# Patient Record
Sex: Male | Born: 1980
Health system: Southern US, Community
[De-identification: ages and names within clinical notes are randomized; demographics above are authoritative.]

## PROBLEM LIST (undated history)

## (undated) DIAGNOSIS — I10 Essential (primary) hypertension: Secondary | ICD-10-CM

## (undated) HISTORY — DX: Essential (primary) hypertension: I10

---

## 2008-03-03 ENCOUNTER — Emergency Department (HOSPITAL_COMMUNITY): Admission: EM | Admit: 2008-03-03 | Discharge: 2008-03-03 | Payer: Self-pay | Admitting: Emergency Medicine

## 2012-07-16 ENCOUNTER — Emergency Department (HOSPITAL_COMMUNITY)
Admission: EM | Admit: 2012-07-16 | Discharge: 2012-07-16 | Disposition: A | Discharge status: Home / Self Care | Payer: No Typology Code available for payment source | Attending: Emergency Medicine | Admitting: Emergency Medicine

## 2012-07-16 ENCOUNTER — Encounter (HOSPITAL_COMMUNITY): Payer: Self-pay | Admitting: Emergency Medicine

## 2012-07-16 ENCOUNTER — Emergency Department (HOSPITAL_COMMUNITY): Payer: No Typology Code available for payment source

## 2012-07-16 DIAGNOSIS — S199XXA Unspecified injury of neck, initial encounter: Secondary | ICD-10-CM | POA: Insufficient documentation

## 2012-07-16 DIAGNOSIS — M549 Dorsalgia, unspecified: Secondary | ICD-10-CM | POA: Insufficient documentation

## 2012-07-16 DIAGNOSIS — R209 Unspecified disturbances of skin sensation: Secondary | ICD-10-CM | POA: Insufficient documentation

## 2012-07-16 DIAGNOSIS — Y92410 Unspecified street and highway as the place of occurrence of the external cause: Secondary | ICD-10-CM | POA: Insufficient documentation

## 2012-07-16 DIAGNOSIS — S0993XA Unspecified injury of face, initial encounter: Secondary | ICD-10-CM | POA: Insufficient documentation

## 2012-07-16 DIAGNOSIS — Y9389 Activity, other specified: Secondary | ICD-10-CM | POA: Insufficient documentation

## 2012-07-16 MED ORDER — CYCLOBENZAPRINE HCL 10 MG PO TABS
10.0000 mg | ORAL_TABLET | Freq: Once | ORAL | Status: AC
  Administered 2012-07-16: 10 mg via ORAL
  Filled 2012-07-16: qty 1

## 2012-07-16 MED ORDER — CYCLOBENZAPRINE HCL 10 MG PO TABS: 10.0000 mg | ORAL_TABLET | Freq: Two times a day (BID) | ORAL | Status: DC | PRN
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
## 2012-07-16 NOTE — ED Provider Notes (Signed)
Medical screening examination/treatment/procedure(s) were performed by non-physician practitioner and as supervising physician I was immediately available for consultation/collaboration.  Derwood Kaplan, MD 07/16/12 1714

## 2012-07-23 ENCOUNTER — Emergency Department (HOSPITAL_COMMUNITY)
Admission: EM | Admit: 2012-07-23 | Discharge: 2012-07-23 | Disposition: A | Discharge status: Home / Self Care | Payer: No Typology Code available for payment source | Attending: Emergency Medicine | Admitting: Emergency Medicine

## 2012-07-23 ENCOUNTER — Encounter (HOSPITAL_COMMUNITY): Payer: Self-pay | Admitting: *Deleted

## 2012-07-23 DIAGNOSIS — Z87828 Personal history of other (healed) physical injury and trauma: Secondary | ICD-10-CM | POA: Insufficient documentation

## 2012-07-23 DIAGNOSIS — M542 Cervicalgia: Secondary | ICD-10-CM

## 2012-07-23 DIAGNOSIS — G8911 Acute pain due to trauma: Secondary | ICD-10-CM | POA: Insufficient documentation

## 2012-07-23 DIAGNOSIS — M549 Dorsalgia, unspecified: Secondary | ICD-10-CM | POA: Insufficient documentation

## 2012-07-23 MED ORDER — IBUPROFEN 600 MG PO TABS: 600.0000 mg | ORAL_TABLET | Freq: Four times a day (QID) | ORAL | Status: DC | PRN

## 2012-07-23 MED ORDER — METHOCARBAMOL 500 MG PO TABS: 1000.0000 mg | ORAL_TABLET | Freq: Four times a day (QID) | ORAL | Status: DC

## 2012-07-23 MED ORDER — HYDROCODONE-ACETAMINOPHEN 5-325 MG PO TABS: ORAL_TABLET | ORAL | Status: DC
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
## 2012-07-25 NOTE — ED Provider Notes (Signed)
Medical screening examination/treatment/procedure(s) were conducted as a shared visit with non-physician practitioner(s) and myself.  I personally evaluated the patient during the encounter  Gianlucas Evenson, MD 07/25/12 1408 

## 2012-07-31 ENCOUNTER — Encounter (HOSPITAL_COMMUNITY): Payer: Self-pay | Admitting: Emergency Medicine

## 2012-07-31 ENCOUNTER — Emergency Department (HOSPITAL_COMMUNITY)
Admission: EM | Admit: 2012-07-31 | Discharge: 2012-07-31 | Disposition: A | Discharge status: Home / Self Care | Payer: No Typology Code available for payment source | Attending: Emergency Medicine | Admitting: Emergency Medicine

## 2012-07-31 DIAGNOSIS — M549 Dorsalgia, unspecified: Secondary | ICD-10-CM

## 2012-07-31 DIAGNOSIS — M5380 Other specified dorsopathies, site unspecified: Secondary | ICD-10-CM | POA: Insufficient documentation

## 2012-07-31 DIAGNOSIS — Y92410 Unspecified street and highway as the place of occurrence of the external cause: Secondary | ICD-10-CM | POA: Insufficient documentation

## 2012-07-31 DIAGNOSIS — M62830 Muscle spasm of back: Secondary | ICD-10-CM

## 2012-07-31 DIAGNOSIS — S29019A Strain of muscle and tendon of unspecified wall of thorax, initial encounter: Secondary | ICD-10-CM

## 2012-07-31 DIAGNOSIS — IMO0002 Reserved for concepts with insufficient information to code with codable children: Secondary | ICD-10-CM | POA: Insufficient documentation

## 2012-07-31 DIAGNOSIS — Y9389 Activity, other specified: Secondary | ICD-10-CM | POA: Insufficient documentation

## 2012-07-31 DIAGNOSIS — S239XXA Sprain of unspecified parts of thorax, initial encounter: Secondary | ICD-10-CM | POA: Insufficient documentation

## 2012-07-31 MED ORDER — HYDROCODONE-ACETAMINOPHEN 5-325 MG PO TABS
2.0000 | ORAL_TABLET | Freq: Once | ORAL | Status: AC
  Administered 2012-07-31: 2 via ORAL
  Filled 2012-07-31: qty 2

## 2012-07-31 MED ORDER — DIAZEPAM 5 MG PO TABS
10.0000 mg | ORAL_TABLET | Freq: Once | ORAL | Status: AC
  Administered 2012-07-31: 10 mg via ORAL
  Filled 2012-07-31: qty 1

## 2012-07-31 MED ORDER — MELOXICAM 15 MG PO TABS: 15.0000 mg | ORAL_TABLET | Freq: Every day | ORAL | Status: DC

## 2012-07-31 MED ORDER — OXYCODONE-ACETAMINOPHEN 5-325 MG PO TABS: 1.0000 | ORAL_TABLET | ORAL | Status: DC | PRN

## 2012-07-31 MED ORDER — DIAZEPAM 5 MG PO TABS: 5.0000 mg | ORAL_TABLET | Freq: Three times a day (TID) | ORAL | Status: DC | PRN
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
## 2012-08-01 NOTE — ED Provider Notes (Signed)
Medical screening examination/treatment/procedure(s) were performed by non-physician practitioner and as supervising physician I was immediately available for consultation/collaboration.  Jones Skene, M.D.     Jones Skene, MD 08/01/12 1643

## 2013-04-26 ENCOUNTER — Encounter (HOSPITAL_COMMUNITY): Payer: Self-pay | Admitting: *Deleted

## 2013-04-26 ENCOUNTER — Emergency Department (HOSPITAL_COMMUNITY)
Admission: EM | Admit: 2013-04-26 | Discharge: 2013-04-26 | Disposition: A | Discharge status: Home / Self Care | Payer: 59 | Attending: Emergency Medicine | Admitting: Emergency Medicine

## 2013-04-26 DIAGNOSIS — Y9389 Activity, other specified: Secondary | ICD-10-CM | POA: Insufficient documentation

## 2013-04-26 DIAGNOSIS — Z23 Encounter for immunization: Secondary | ICD-10-CM | POA: Insufficient documentation

## 2013-04-26 DIAGNOSIS — Y9289 Other specified places as the place of occurrence of the external cause: Secondary | ICD-10-CM | POA: Insufficient documentation

## 2013-04-26 DIAGNOSIS — W268XXA Contact with other sharp object(s), not elsewhere classified, initial encounter: Secondary | ICD-10-CM | POA: Insufficient documentation

## 2013-04-26 DIAGNOSIS — S61209A Unspecified open wound of unspecified finger without damage to nail, initial encounter: Secondary | ICD-10-CM | POA: Insufficient documentation

## 2013-04-26 DIAGNOSIS — IMO0002 Reserved for concepts with insufficient information to code with codable children: Secondary | ICD-10-CM

## 2013-04-26 MED ORDER — TETANUS-DIPHTH-ACELL PERTUSSIS 5-2.5-18.5 LF-MCG/0.5 IM SUSP
0.5000 mL | Freq: Once | INTRAMUSCULAR | Status: AC
  Administered 2013-04-26: 0.5 mL via INTRAMUSCULAR
  Filled 2013-04-26: qty 0.5

## 2013-04-26 NOTE — ED Provider Notes (Addendum)
CSN: [id number]     Arrival date & time 04/26/13  7829 History   First MD Initiated Contact with Patient 04/26/13 [phone]     Chief Complaint  Patient presents with  . Laceration   (Consider location/radiation/quality/duration/timing/severity/associated sxs/prior Treatment) HPI Comments: Laceration to L lateral pinkie finger from sheet metal about 20 minutes ago.  Bleeding controlled.  Tetanus not up to date. Flexion, extension, sensation intact.    The history is provided by the patient.    History reviewed. No pertinent past medical history. History reviewed. No pertinent past surgical history. No family history on file. History  Substance Use Topics  . Smoking status: Never Smoker   . Smokeless tobacco: Not on file  . Alcohol Use: Yes     Comment: occassionally    Review of Systems  Constitutional: Negative for activity change and appetite change.  HENT: Negative for congestion and rhinorrhea.   Respiratory: Negative for chest tightness and shortness of breath.   Gastrointestinal: Negative for vomiting and abdominal pain.  Genitourinary: Negative for dysuria and hematuria.  Musculoskeletal: Negative for back pain.  Skin: Positive for wound.  Neurological: Negative for dizziness, weakness and headaches.  A complete 10 system review of systems was obtained and all systems are negative except as noted in the HPI and PMH.    Allergies  Review of patient's allergies indicates no known allergies.  Home Medications   Current Outpatient Rx  Name  Route  Sig  Dispense  Refill  . Multiple Vitamin (MULTIVITAMIN WITH MINERALS) TABS tablet   Oral   Take 1 tablet by mouth daily.          BP 145/98  Pulse 69  Temp(Src) 98.3 F (36.8 C) (Oral)  Resp 14  SpO2 96% Physical Exam  Constitutional: He is oriented to person, place, and time. He appears well-developed and well-nourished. No distress.  HENT:  Head: Normocephalic and atraumatic.  Mouth/Throat: Oropharynx is clear  and moist. No oropharyngeal exudate.  Eyes: Conjunctivae and EOM are normal. Pupils are equal, round, and reactive to light.  Neck: Normal range of motion. Neck supple.  Cardiovascular: Normal rate, regular rhythm and normal heart sounds.   Pulmonary/Chest: Effort normal and breath sounds normal. No respiratory distress.  Abdominal: Soft. There is no tenderness. There is no rebound and no guarding.  Musculoskeletal: Normal range of motion. He exhibits no edema and no tenderness.  4 cm laceration to left lateral pinky finger. FDS, FDP intact. Extension intact. Sensation intact distally.  Neurological: He is alert and oriented to person, place, and time. No cranial nerve deficit. He exhibits normal muscle tone. Coordination normal.  Skin: Skin is warm.    ED Course  NERVE BLOCK Performed by: Glynn Octave Authorized by: Glynn Octave Consent: Verbal consent obtained. Risks and benefits: risks, benefits and alternatives were discussed Consent given by: patient Patient understanding: patient states understanding of the procedure being performed Patient identity confirmed: verbally with patient and arm band Time out: Immediately prior to procedure a "time out" was called to verify the correct patient, procedure, equipment, support staff and site/side marked as required. Indications: extensive wound Body area: upper extremity Nerve: digital Laterality: left Patient sedated: no Preparation: Patient was prepped and draped in the usual sterile fashion. Patient position: sitting Needle gauge: 25 G Location technique: anatomical landmarks Local anesthetic: lidocaine 1% without epinephrine Anesthetic total: 8 ml Outcome: pain improved Patient tolerance: Patient tolerated the procedure well with no immediate complications.   (including critical care time) Labs  Review Labs Reviewed - No data to display Imaging Review No results found.  MDM   1. Laceration    Laceration to L  little finger from sheet metal.  Vitals stable.  No tendon or nerve involvement.  Tetanus updated. Digital block by myself. Wound repaired by NP Walker.  Suture removal in 1 week. Wound care and return precautions discussed.  Glynn Octave, MD 04/26/13 1011  Glynn Octave, MD 04/26/13 1325

## 2013-04-26 NOTE — ED Provider Notes (Signed)
CSN: [id number]     Arrival date & time 04/26/13  4098 History   First MD Initiated Contact with Patient 04/26/13 [phone]     Chief Complaint  Patient presents with  . Laceration   (Consider location/radiation/quality/duration/timing/severity/associated sxs/prior Treatment) HPI (see Dr. Randel Books note)   History reviewed. No pertinent past medical history. History reviewed. No pertinent past surgical history. No family history on file. History  Substance Use Topics  . Smoking status: Never Smoker   . Smokeless tobacco: Not on file  . Alcohol Use: Yes     Comment: occassionally    Review of Systems  Allergies  Review of patient's allergies indicates no known allergies.  Home Medications   Current Outpatient Rx  Name  Route  Sig  Dispense  Refill  . Multiple Vitamin (MULTIVITAMIN WITH MINERALS) TABS tablet   Oral   Take 1 tablet by mouth daily.          BP 145/98  Pulse 69  Temp(Src) 98.3 F (36.8 C) (Oral)  Resp 14  SpO2 96% Physical Exam  ED Course  Procedures (including critical care time) Labs Review Labs Reviewed - No data to display Imaging Review No results found. LACERATION REPAIR Performed by: Irish Elders Authorized by: Irish Elders Consent: Verbal consent obtained. Risks and benefits: risks, benefits and alternatives were discussed Consent given by: patient Patient identity confirmed: provided demographic data Prepped and Draped in normal sterile fashion Wound explored  Laceration Location: Left hand, 5th digit  Laceration Length:  3 cm  No Foreign Bodies seen or palpated  Anesthesia: local infiltration  Digital block by Dr, Manus Gunning, see note  Irrigation method: syringe Amount of cleaning: standard  Skin closure: 3.0 ethilon  Number of sutures: 12  Technique: single mattress  Patient tolerance: Patient tolerated the procedure well with no immediate complications.  Initially seen by Dr. Manus Gunning, see his note for HPI, ROS and  PE.   MDM  No diagnosis found.    Irish Elders, NP 04/26/13 4782  Irish Elders, NP 04/26/13 [phone]

## 2013-04-26 NOTE — ED Provider Notes (Signed)
Medical screening examination/treatment/procedure(s) were conducted as a shared visit with non-physician practitioner(s) and myself.  I personally evaluated the patient during the encounter  See my additional note  Glynn Octave, MD 04/26/13 1325

## 2013-04-26 NOTE — ED Notes (Signed)
Pt reports getting left pinky finger stuck in between sheets of metal about 20 minutes ago. Laceration to left pinky finger. Pt applying pressure with cloth - Is still actively bleeding. RN cleaned wound with saline and re-wrapped in gauze. Pt in NAD

## 2013-05-14 ENCOUNTER — Encounter (HOSPITAL_COMMUNITY): Payer: Self-pay | Admitting: Emergency Medicine

## 2013-05-14 ENCOUNTER — Emergency Department (HOSPITAL_COMMUNITY)
Admission: EM | Admit: 2013-05-14 | Discharge: 2013-05-14 | Disposition: A | Discharge status: Home / Self Care | Payer: 59 | Attending: Emergency Medicine | Admitting: Emergency Medicine

## 2013-05-14 DIAGNOSIS — R1013 Epigastric pain: Secondary | ICD-10-CM

## 2013-05-14 DIAGNOSIS — R197 Diarrhea, unspecified: Secondary | ICD-10-CM | POA: Insufficient documentation

## 2013-05-14 DIAGNOSIS — Z79899 Other long term (current) drug therapy: Secondary | ICD-10-CM | POA: Insufficient documentation

## 2013-05-14 DIAGNOSIS — R110 Nausea: Secondary | ICD-10-CM | POA: Insufficient documentation

## 2013-05-14 DIAGNOSIS — K3189 Other diseases of stomach and duodenum: Secondary | ICD-10-CM | POA: Insufficient documentation

## 2013-05-14 LAB — CBC WITH DIFFERENTIAL/PLATELET
Basophils Relative: 0 % (ref 0–1)
HCT: 43.6 % (ref 39.0–52.0)
Hemoglobin: 14.4 g/dL (ref 13.0–17.0)
Lymphocytes Relative: 24 % (ref 12–46)
Lymphs Abs: 2 10*3/uL (ref 0.7–4.0)
Monocytes Absolute: 0.6 10*3/uL (ref 0.1–1.0)
Monocytes Relative: 8 % (ref 3–12)
Neutro Abs: 5.4 10*3/uL (ref 1.7–7.7)
Neutrophils Relative %: 64 % (ref 43–77)
RBC: 4.84 MIL/uL (ref 4.22–5.81)
WBC: 8.5 10*3/uL (ref 4.0–10.5)

## 2013-05-14 LAB — URINALYSIS, ROUTINE W REFLEX MICROSCOPIC
Glucose, UA: NEGATIVE mg/dL
Ketones, ur: NEGATIVE mg/dL
Leukocytes, UA: NEGATIVE
Protein, ur: NEGATIVE mg/dL
Urobilinogen, UA: 1 mg/dL (ref 0.0–1.0)

## 2013-05-14 LAB — COMPREHENSIVE METABOLIC PANEL
Albumin: 4.4 g/dL (ref 3.5–5.2)
Alkaline Phosphatase: 67 U/L (ref 39–117)
BUN: 11 mg/dL (ref 6–23)
CO2: 25 mEq/L (ref 19–32)
Chloride: 101 mEq/L (ref 96–112)
Creatinine, Ser: 1.05 mg/dL (ref 0.50–1.35)
GFR calc non Af Amer: >90 mL/min (ref >90)
Glucose, Bld: 92 mg/dL (ref 70–99)
Potassium: 4.2 mEq/L (ref 3.5–5.1)
Total Bilirubin: 0.3 mg/dL (ref 0.3–1.2)

## 2013-05-14 MED ORDER — PANTOPRAZOLE SODIUM 20 MG PO TBEC: 20.0000 mg | DELAYED_RELEASE_TABLET | Freq: Every day | ORAL | Status: DC

## 2013-05-14 MED ORDER — PROMETHAZINE HCL 25 MG PO TABS: 25.0000 mg | ORAL_TABLET | Freq: Four times a day (QID) | ORAL | Status: DC | PRN

## 2013-05-14 MED ORDER — DICYCLOMINE HCL 20 MG PO TABS: 20.0000 mg | ORAL_TABLET | Freq: Two times a day (BID) | ORAL | Status: DC | PRN

## 2013-05-14 NOTE — ED Provider Notes (Signed)
CSN: [id number]     Arrival date & time 05/14/13  1752 History   First MD Initiated Contact with Patient 05/14/13 2006     Chief Complaint  Patient presents with  . Nausea  . Diarrhea   (Consider location/radiation/quality/duration/timing/severity/associated sxs/prior Treatment) HPI   Alex Johnson is a 32 y.o. male with PMH significant for GERD c/o diarrhea onset 4 days ago, resolved 3 days ago with persistent nausea and dyspepsia. Denies fever, abd pain, melena, hematochezia, emesis, change in bladder habits, CP, SOB. Pt has been taking zantac with no releif.   No PCP  History reviewed. No pertinent past medical history. No past surgical history on file. No family history on file. History  Substance Use Topics  . Smoking status: Never Smoker   . Smokeless tobacco: Not on file  . Alcohol Use: Yes     Comment: occassionally    Review of Systems 10 systems reviewed and found to be negative, except as noted in the HPI   Allergies  Review of patient's allergies indicates no known allergies.  Home Medications   Current Outpatient Rx  Name  Route  Sig  Dispense  Refill  . Multiple Vitamin (MULTIVITAMIN WITH MINERALS) TABS tablet   Oral   Take 1 tablet by mouth daily.          BP 154/112  Pulse 74  Temp(Src) 98.8 F (37.1 C) (Oral)  Resp 16  SpO2 100% Physical Exam  Nursing note and vitals reviewed. Constitutional: He is oriented to person, place, and time. He appears well-developed and well-nourished. No distress.  HENT:  Head: Normocephalic.  Mouth/Throat: Oropharynx is clear and moist.  Eyes: Conjunctivae and EOM are normal. Pupils are equal, round, and reactive to light.  Neck: Normal range of motion.  Cardiovascular: Normal rate, regular rhythm and intact distal pulses.   Pulmonary/Chest: Effort normal and breath sounds normal. No stridor. No respiratory distress. He has no wheezes. He has no rales. He exhibits no tenderness.  Abdominal: Soft. Bowel  sounds are normal. He exhibits no distension and no mass. There is no tenderness. There is no rebound and no guarding.  Musculoskeletal: Normal range of motion.  Neurological: He is alert and oriented to person, place, and time.  Psychiatric: He has a normal mood and affect.    ED Course  Procedures (including critical care time) Labs Review Labs Reviewed  CBC WITH DIFFERENTIAL  COMPREHENSIVE METABOLIC PANEL  URINALYSIS, ROUTINE W REFLEX MICROSCOPIC  LIPASE, BLOOD   Imaging Review No results found.  MDM   1. Nausea alone   2. Dyspepsia     Filed Vitals:   05/14/13 1800 05/14/13 2109  BP: 154/112 146/99  Pulse: 74 71  Temp: 98.8 F (37.1 C) 98 F (36.7 C)  TempSrc: Oral Oral  Resp: 16 18  SpO2: 100% 100%     Alex Johnson is a 32 y.o. male with resolved diarrhea and dyspepsia. Abd exam is benign, no abnormalities on bloodwork or UA  Pt is hemodynamically stable, appropriate for, and amenable to discharge at this time. Pt verbalized understanding and agrees with care plan. All questions answered. Outpatient follow-up and specific return precautions discussed.    Discharge Medication List as of 05/14/2013  9:04 PM    START taking these medications   Details  dicyclomine (BENTYL) 20 MG tablet Take 1 tablet (20 mg total) by mouth 2 (two) times daily as needed (stomach upset)., Starting 05/14/2013, Until Discontinued, Print    pantoprazole (PROTONIX) 20 MG tablet  Take 1 tablet (20 mg total) by mouth daily., Starting 05/14/2013, Until Discontinued, Print    promethazine (PHENERGAN) 25 MG tablet Take 1 tablet (25 mg total) by mouth every 6 (six) hours as needed for nausea., Starting 05/14/2013, Until Discontinued, Print        Note: Portions of this report may have been transcribed using voice recognition software. Every effort was made to ensure accuracy; however, inadvertent computerized transcription errors may be present      Wynetta Emery, PA-C 05/14/13  2124

## 2013-05-14 NOTE — ED Provider Notes (Signed)
Medical screening examination/treatment/procedure(s) were performed by non-physician practitioner and as supervising physician I was immediately available for consultation/collaboration.     Kymberlyn Eckford R Charlsie Fleeger, MD 05/14/13 2355 

## 2013-05-14 NOTE — ED Notes (Signed)
Pt states that he has been feeling nauseated and having diarrhea since Sunday.  Has been treating it with pepto bismol.  No abd pain.

## 2016-01-12 ENCOUNTER — Encounter (HOSPITAL_BASED_OUTPATIENT_CLINIC_OR_DEPARTMENT_OTHER): Payer: Self-pay

## 2016-01-12 ENCOUNTER — Emergency Department (HOSPITAL_BASED_OUTPATIENT_CLINIC_OR_DEPARTMENT_OTHER)
Admission: EM | Admit: 2016-01-12 | Discharge: 2016-01-12 | Disposition: A | Discharge status: Home / Self Care | Payer: No Typology Code available for payment source | Attending: Emergency Medicine | Admitting: Emergency Medicine

## 2016-01-12 DIAGNOSIS — K920 Hematemesis: Secondary | ICD-10-CM

## 2016-01-12 DIAGNOSIS — K219 Gastro-esophageal reflux disease without esophagitis: Secondary | ICD-10-CM

## 2016-01-12 DIAGNOSIS — I10 Essential (primary) hypertension: Secondary | ICD-10-CM

## 2016-01-12 MED ORDER — PANTOPRAZOLE SODIUM 20 MG PO TBEC: 20.0000 mg | DELAYED_RELEASE_TABLET | Freq: Every day | ORAL | Status: AC

## 2016-01-12 MED ORDER — AMLODIPINE BESYLATE 5 MG PO TABS: 5.0000 mg | ORAL_TABLET | Freq: Every day | ORAL | Status: DC

## 2016-01-12 MED ORDER — GI COCKTAIL ~~LOC~~
30.0000 mL | Freq: Once | ORAL | Status: AC
  Administered 2016-01-12: 30 mL via ORAL
  Filled 2016-01-12: qty 30

## 2016-01-12 MED FILL — PANTOPRAZOLE SOD DR 20 MG T: 20 | 60 days supply | Qty: 60 | Fill #0

## 2016-01-12 MED FILL — AMLODIPINE BESYLATE 5 MG TA: 5 | 30 days supply | Qty: 30 | Fill #0

## 2016-01-12 NOTE — ED Notes (Signed)
MD at bedside. 

## 2016-01-12 NOTE — Discharge Instructions (Signed)
Food Choices for Gastroesophageal Reflux Disease, Adult When you have gastroesophageal reflux disease (GERD), the foods you eat and your eating habits are very important. Choosing the right foods can help ease your discomfort.  WHAT GUIDELINES DO I NEED TO FOLLOW?   Choose fruits, vegetables, whole grains, and low-fat dairy products.   Choose low-fat meat, fish, and poultry.  Limit fats such as oils, salad dressings, butter, nuts, and avocado.   Keep a food diary. This helps you identify foods that cause symptoms.   Avoid foods that cause symptoms. These may be different for everyone.   Eat small meals often instead of 3 large meals a day.   Eat your meals slowly, in a place where you are relaxed.   Limit fried foods.   Cook foods using methods other than frying.   Avoid drinking alcohol.   Avoid drinking large amounts of liquids with your meals.   Avoid bending over or lying down until 2-3 hours after eating.  WHAT FOODS ARE NOT RECOMMENDED?  These are some foods and drinks that may make your symptoms worse: Vegetables Tomatoes. Tomato juice. Tomato and spaghetti sauce. Chili peppers. Onion and garlic. Horseradish. Fruits Oranges, grapefruit, and lemon (fruit and juice). Meats High-fat meats, fish, and poultry. This includes hot dogs, ribs, ham, sausage, salami, and bacon. Dairy Whole milk and chocolate milk. Sour cream. Cream. Butter. Ice cream. Cream cheese.  Drinks Coffee and tea. Bubbly (carbonated) drinks or energy drinks. Condiments Hot sauce. Barbecue sauce.  Sweets/Desserts Chocolate and cocoa. Donuts. Peppermint and spearmint. Fats and Oils High-fat foods. This includes [place] fries and potato chips. Other Vinegar. Strong spices. This includes black pepper, white pepper, red pepper, cayenne, curry powder, cloves, ginger, and chili powder. The items listed above may not be a complete list of foods and drinks to avoid. Contact your dietitian for more  information.   This information is not intended to replace advice given to you by your health care provider. Make sure you discuss any questions you have with your health care provider.   Document Released: 01/30/2012 Document Revised: 08/21/2014 Document Reviewed: 06/04/2013 Elsevier Interactive Patient Education 2016 Elsevier Inc.  Gastroesophageal Reflux Disease, Adult Normally, food travels down the esophagus and stays in the stomach to be digested. If a person has gastroesophageal reflux disease (GERD), food and stomach acid move back up into the esophagus. When this happens, the esophagus becomes sore and swollen (inflamed). Over time, GERD can make small holes (ulcers) in the lining of the esophagus. HOME CARE Diet  Follow a diet as told by your doctor. You may need to avoid foods and drinks such as:  Coffee and tea (with or without caffeine).  Drinks that contain alcohol.  Energy drinks and sports drinks.  Carbonated drinks or sodas.  Chocolate and cocoa.  Peppermint and mint flavorings.  Garlic and onions.  Horseradish.  Spicy and acidic foods, such as peppers, chili powder, curry powder, vinegar, hot sauces, and BBQ sauce.  Citrus fruit juices and citrus fruits, such as oranges, lemons, and limes.  Tomato-based foods, such as red sauce, chili, salsa, and pizza with red sauce.  Fried and fatty foods, such as donuts, french fries, potato chips, and high-fat dressings.  High-fat meats, such as hot dogs, rib eye steak, sausage, ham, and bacon.  High-fat dairy items, such as whole milk, butter, and cream cheese.  Eat small meals often. Avoid eating large meals.  Avoid drinking large amounts of liquid with your meals.  Avoid eating meals  during the 2-3 hours before bedtime.  Avoid lying down right after you eat.  Do not exercise right after you eat. General Instructions  Pay attention to any changes in your symptoms.  Take over-the-counter and  prescription medicines only as told by your doctor. Do not take aspirin, ibuprofen, or other NSAIDs unless your doctor says it is okay.  Do not use any tobacco products, including cigarettes, chewing tobacco, and e-cigarettes. If you need help quitting, ask your doctor.  Wear loose clothes. Do not wear anything tight around your waist.  Raise (elevate) the head of your bed about 6 inches (15 cm).  Try to lower your stress. If you need help doing this, ask your doctor.  If you are overweight, lose an amount of weight that is healthy for you. Ask your doctor about a safe weight loss goal.  Keep all follow-up visits as told by your doctor. This is important. GET HELP IF:  You have new symptoms.  You lose weight and you do not know why it is happening.  You have trouble swallowing, or it hurts to swallow.  You have wheezing or a cough that keeps happening.  Your symptoms do not get better with treatment.  You have a hoarse voice. GET HELP RIGHT AWAY IF:  You have pain in your arms, neck, jaw, teeth, or back.  You feel sweaty, dizzy, or light-headed.  You have chest pain or shortness of breath.  You throw up (vomit) and your throw up looks like blood or coffee grounds.  You pass out (faint).  Your poop (stool) is bloody or black.  You cannot swallow, drink, or eat.   This information is not intended to replace advice given to you by your health care provider. Make sure you discuss any questions you have with your health care provider.   Document Released: 01/17/2008 Document Revised: 04/21/2015 Document Reviewed: 11/25/2014 Elsevier Interactive Patient Education 2016 [employer].  Hypertension Hypertension is another name for high blood pressure. High blood pressure forces your heart to work harder to pump blood. A blood pressure reading has two numbers, which includes a higher number over a lower number (example: 110/72). HOME CARE   Have your blood pressure rechecked  by your doctor.  Only take medicine as told by your doctor. Follow the directions carefully. The medicine does not work as well if you skip doses. Skipping doses also puts you at risk for problems.  Do not smoke.  Monitor your blood pressure at home as told by your doctor. GET HELP IF:  You think you are having a reaction to the medicine you are taking.  You have repeat headaches or feel dizzy.  You have puffiness (swelling) in your ankles.  You have trouble with your vision. GET HELP RIGHT AWAY IF:   You get a very bad headache and are confused.  You feel weak, numb, or faint.  You get chest or belly (abdominal) pain.  You throw up (vomit).  You cannot breathe very well. MAKE SURE YOU:   Understand these instructions.  Will watch your condition.  Will get help right away if you are not doing well or get worse.   This information is not intended to replace advice given to you by your health care provider. Make sure you discuss any questions you have with your health care provider.   Document Released: 01/17/2008 Document Revised: 08/05/2013 Document Reviewed: 05/23/2013 Elsevier Interactive Patient Education 2016 [employer]. [employer] Assistance [employer]  Ways 211 is a great source of information about community services available.  Access by dialing 2-1-1 from anywhere in [place], or by website -  PooledIncome.plwww.nc211.org.   Other Local Resources (Updated 08/2015)  Financial Assistance   Services    Phone Number and Address  [facility]  Low-cost medical care - 1st and 3rd Saturday of every month  Must not qualify for public or private insurance and must have limited income [phone] [address] [place], [place]    [place] Department of Social Services  Child care  Emergency assistance for housing and Kimberly-Clarkutilities  Food stamps  Medicaid [phone] [address] [place], [place]  [id number]   [facility]  Low-cost medical care for children, communicable diseases, sexually-transmitted diseases, immunizations, maternity care, womens health and family planning [phone] [address] [place], [place] [address]  [facility]   Medication assistance for [facility] residents  Must meet income requirements [phone] [address] [place], [place].    [facility]  Child care  Emergency assistance for housing and Kimberly-Clarkutilities  Food stamps  Medicaid [phone] [address] [place], [place] [address]  [facility]   Low-cost medical care,   Monday through Friday, 9 am to 6 pm.   Accepts Medicare/Medicaid, and self-pay [phone] [address] [place], [place] [address]  [facility]  Low-cost medical care - Monday through Friday, 8:30 am - 5:30 pm  Accepts Medicaid and self-pay [phone] [address] [place], [place] [address]   [facility]  Primary medical care, including for those with sickle cell disease  Accepts Medicare, Medicaid, insurance and self-pay [phone] [address] [place], [place]  [facility]   Primary medical care  Accepts Medicare, [place], insurance and self-pay [phone] [address] [place], [place] [address]   [facility] Social Services  Child care  Emergency assistance for housing and Kimberly-Clarkutilities  Food stamps  Medicaid [phone] [address] [place], [place] [address]  [facility] Department of Health and [employer] Services  Child care  Emergency assistance for housing and Kimberly-Clarkutilities  Food stamps  Medicaid [phone] [address] [place], [place] [address]   [facility]  Medication assistance for [facility]  residents with no insurance only  Must have a primary care doctor [phone] [address] [place], [place]  [facility]   Primary medical care  [place] Medicare, [place], insurance  [phone] [address] [place], [place]  Med[facility]ist   Medication assistance [phone]  [facility]   Primary medical care  Accepts Medicare, [place], insurance and self-pay [phone] [address] [place], [place] [address]  [facility]   Primary medical care  Accepts Medicare, [place], insurance and self-pay [phone] [address] [place], [place] [address]  [facility]  For [place] residents between the ages of 5918 and 264 who do not have any form of health insurance, Medicare, [place]icaid, or [place] benefits.  Services are provided free of charge to uninsured patients who fall within federal poverty guidelines.    Hours: Tuesdays and Thursdays, 4:15 - 8 pm [phone] [address], [address] [place], [place] [address]  [facility]     Primary medical care  Dental care  Nutritional counseling  Pharmacy  Accepts Medicaid, Medicare, most insurance.  Fees are adjusted based on ability to pay.   [phone] [facility] [address] [place], [place]  [phone] [facility] [address] [place], [place]  [phone] [facility] [place], [place]  [phone] [facility], [address] [place], [place]  [phone] [facility] [address] [place], [place]  [facility]  Womens health and family planning [phone] [address] [place], [place]  [facility] Department of Social Services  Child care  Emergency assistance for housing and [employer]  Medicaid [phone] [address], [place], [place] [address]   [facility]    Ages 27 and older  Hours: Mondays and Thursdays, 7:00 am - 9:00 am Patients are seen on a first come, first served basis. [phone], [phone] [place], [place]  [facility] Division of Social Services  Child care  Emergency assistance for housing and [employer]  Medicaid [phone] [address] [place], [place] [address]  [facility] [employer]  Medication assistance  Rental assistance  Food pantry  Medication assistance  Housing assistance  Emergency food distribution  Utility assistance [phone] [address] [place], [place]  [phone]  [address] [place], [place] [address] Hours: Tuesdays and Thursdays from 9am - 12 noon by appointment only  [phone] [address] [place], [place] [address]  [facility] - Lanae Boast   Accepts private insurance, [place], and [place].  Payment is based on a sliding scale for those without insurance.  Hours: Mondays, Tuesdays and Thursdays, 8:30 am - 5:30 pm.   [phone] [address] [place], [place]  [facility] - Family Medicine at [facility], [place], and [place].  Payment is based on a sliding scale for those without insurance. [phone] [address] [place], [place]  [facility] - Pediatrics at E. Scientist, research (physical sciences), [employer], and [place].  Payment is based on a sliding scale for those without insurance [phone] [address], [employer], [place]  [facility] - Pediatrics at [employer], [place], and [place].  Payment is based on a sliding scale for those without insurance. [phone] [address] [place], [place]  [facility] - Pediatrics at [facility],  [place], and [place].  Payment is based on a sliding scale for those without insurance. [phone], [phone] [place], [place].    [facility]  Maternity care.  Accepts Medicaid and self-pay. [phone] [address] [place], [place]

## 2016-01-12 NOTE — ED Notes (Addendum)
Pt states he vomited x 1 this am after drinking vinegar for heartburn-states he has self diagnosed heartburn that is relieved by antacids-does not have PCP-NAD-steady gait-pt on bluetooth phone in triage

## 2016-01-12 NOTE — ED Notes (Signed)
Pt states he drank some vinegar this morning because he heard it can treat heart burn. Pt states he vomited x1 and noticed some bright red blood. Pt denies pain or nausea.

## 2016-01-23 NOTE — ED Provider Notes (Signed)
CSN: [id number]     Arrival date & time 01/12/16  1223 History   First MD Initiated Contact with Patient 01/12/16 1314     Chief Complaint  Patient presents with  . Hematemesis     (Consider location/radiation/quality/duration/timing/severity/associated sxs/prior Treatment) HPI   35 year old male with hematemesis. One episode. Describes streaks of blood in his vomit. Her this morning patient was having some chest discomforts which he felt may be heartburn. He drinks some vinegar to try to help the symptoms. He subsequently became nauseated and vomited after this. This is when he noticed the blood. He now currently has no complaints. He denies any further chest discomfort. Denies any recent blood in his stool or dark stools. Is not on any blood thinners. He denies regular NSAID usage.  History reviewed. No pertinent past medical history. History reviewed. No pertinent past surgical history. No family history on file. Social History  Substance Use Topics  . Smoking status: Never Smoker   . Smokeless tobacco: None  . Alcohol Use: Yes     Comment: occ    Review of Systems  All systems reviewed and negative, other than as noted in HPI.   Allergies  Review of patient's allergies indicates no known allergies.  Home Medications   Prior to Admission medications   Medication Sig Start Date End Date Taking? Authorizing Provider  amLODipine (NORVASC) 5 MG tablet Take 1 tablet (5 mg total) by mouth daily. 01/12/16   Raeford RazorStephen Estalee Mccandlish, MD  pantoprazole (PROTONIX) 20 MG tablet Take 1 tablet (20 mg total) by mouth daily. 01/12/16   Raeford RazorStephen Nycole Kawahara, MD   BP 155/118 mmHg  Pulse 92  Temp(Src) 98.1 F (36.7 C) (Oral)  Resp 18  Ht 5\' 7"  (1.702 m)  Wt 256 lb (116.121 kg)  BMI 40.09 kg/m2  SpO2 96% Physical Exam  Constitutional: He is oriented to person, place, and time. He appears well-developed and well-nourished. No distress.  HENT:  Head: Normocephalic and atraumatic.  Oropharynx is clear   Eyes: Conjunctivae are normal. Right eye exhibits no discharge. Left eye exhibits no discharge.  Neck: Neck supple.  Cardiovascular: Normal rate, regular rhythm and normal heart sounds.  Exam reveals no gallop and no friction rub.   No murmur heard. Pulmonary/Chest: Effort normal and breath sounds normal. No respiratory distress.  Abdominal: Soft. He exhibits no distension. There is no tenderness.  Musculoskeletal: He exhibits no edema or tenderness.  Neurological: He is alert and oriented to person, place, and time.  Skin: Skin is warm and dry.  Psychiatric: He has a normal mood and affect. His behavior is normal. Thought content normal.  Nursing note and vitals reviewed.   ED Course  Procedures (including critical care time) Labs Review Labs Reviewed - No data to display  Imaging Review No results found. I have personally reviewed and evaluated these images and lab results as part of my medical decision-making.   EKG Interpretation   Date/Time:  Wednesday Jan 12 2016 12:46:04 EDT Ventricular Rate:  91 PR Interval:  136 QRS Duration: 80 QT Interval:  344 QTC Calculation: 423 R Axis:   86 Text Interpretation:  Normal sinus rhythm with sinus arrhythmia Normal ECG  No old tracing to compare Confirmed by Jyssica Rief  MD, Vernelle Wisner ([id number]) on  01/12/2016 1:27:59 PM      MDM   Final diagnoses:  Gastroesophageal reflux disease, esophagitis presence not specified  Hematemesis without nausea  Essential hypertension    35 year old male who describes minimal hematemesis. Suspect small  Mallory-Weiss tear. No further episodes. He seen in medically stable. No blood thinners. Discussed additional symptoms consistent with gastritis or possible reflux. We'll give a trial of PPI. It has been determined that no acute conditions requiring further emergency intervention are present at this time. The patient has been advised of the diagnosis and plan. I reviewed any labs and imaging including any  potential incidental findings. We have discussed signs and symptoms that warrant return to the ED and they are listed in the discharge instructions.      Raeford Razor, MD 01/23/16 1410

## 2017-09-08 ENCOUNTER — Emergency Department (HOSPITAL_COMMUNITY): Payer: No Typology Code available for payment source

## 2017-09-08 ENCOUNTER — Emergency Department (HOSPITAL_COMMUNITY)
Admission: EM | Admit: 2017-09-08 | Discharge: 2017-09-09 | Disposition: A | Discharge status: Home / Self Care | Payer: No Typology Code available for payment source | Attending: Emergency Medicine | Admitting: Emergency Medicine

## 2017-09-08 ENCOUNTER — Encounter (HOSPITAL_COMMUNITY): Payer: Self-pay | Admitting: Emergency Medicine

## 2017-09-08 ENCOUNTER — Other Ambulatory Visit: Payer: Self-pay

## 2017-09-08 DIAGNOSIS — Y999 Unspecified external cause status: Secondary | ICD-10-CM | POA: Insufficient documentation

## 2017-09-08 DIAGNOSIS — S83521A Sprain of posterior cruciate ligament of right knee, initial encounter: Secondary | ICD-10-CM | POA: Insufficient documentation

## 2017-09-08 DIAGNOSIS — S42021A Displaced fracture of shaft of right clavicle, initial encounter for closed fracture: Secondary | ICD-10-CM | POA: Diagnosis not present

## 2017-09-08 DIAGNOSIS — Y92411 Interstate highway as the place of occurrence of the external cause: Secondary | ICD-10-CM | POA: Diagnosis not present

## 2017-09-08 DIAGNOSIS — S91012A Laceration without foreign body, left ankle, initial encounter: Secondary | ICD-10-CM | POA: Insufficient documentation

## 2017-09-08 DIAGNOSIS — S3991XA Unspecified injury of abdomen, initial encounter: Secondary | ICD-10-CM | POA: Diagnosis not present

## 2017-09-08 DIAGNOSIS — Z79899 Other long term (current) drug therapy: Secondary | ICD-10-CM | POA: Insufficient documentation

## 2017-09-08 DIAGNOSIS — S8991XA Unspecified injury of right lower leg, initial encounter: Secondary | ICD-10-CM

## 2017-09-08 DIAGNOSIS — S51812A Laceration without foreign body of left forearm, initial encounter: Secondary | ICD-10-CM | POA: Diagnosis not present

## 2017-09-08 DIAGNOSIS — Y9389 Activity, other specified: Secondary | ICD-10-CM | POA: Diagnosis not present

## 2017-09-08 DIAGNOSIS — R0789 Other chest pain: Secondary | ICD-10-CM | POA: Insufficient documentation

## 2017-09-08 DIAGNOSIS — S4991XA Unspecified injury of right shoulder and upper arm, initial encounter: Secondary | ICD-10-CM | POA: Diagnosis present

## 2017-09-08 LAB — COMPREHENSIVE METABOLIC PANEL
ALBUMIN: 4.3 g/dL (ref 3.5–5.0)
ALK PHOS: 114 U/L (ref 38–126)
ALT: 41 U/L (ref 17–63)
ANION GAP: 11 (ref 5–15)
AST: 43 U/L — ABNORMAL HIGH (ref 15–41)
BUN: 9 mg/dL (ref 6–20)
CALCIUM: 9.2 mg/dL (ref 8.9–10.3)
CHLORIDE: 104 mmol/L (ref 101–111)
CO2: 25 mmol/L (ref 22–32)
Creatinine, Ser: 1.05 mg/dL (ref 0.61–1.24)
GFR calc non Af Amer: >60 mL/min (ref >60)
GLUCOSE: 165 mg/dL — AB (ref 65–99)
POTASSIUM: 3.7 mmol/L (ref 3.5–5.1)
SODIUM: 140 mmol/L (ref 135–145)
Total Bilirubin: 0.6 mg/dL (ref 0.3–1.2)
Total Protein: 7.5 g/dL (ref 6.5–8.1)

## 2017-09-08 LAB — URINALYSIS, ROUTINE W REFLEX MICROSCOPIC
BACTERIA UA: NONE SEEN
Bilirubin Urine: NEGATIVE
Glucose, UA: 50 mg/dL — AB
HGB URINE DIPSTICK: NEGATIVE
Ketones, ur: NEGATIVE mg/dL
Leukocytes, UA: NEGATIVE
NITRITE: NEGATIVE
Protein, ur: 30 mg/dL — AB
SPECIFIC GRAVITY, URINE: 1.024 (ref 1.005–1.030)
Squamous Epithelial / LPF: NONE SEEN
pH: 7 (ref 5.0–8.0)

## 2017-09-08 LAB — CBC
HCT: 45.6 % (ref 39.0–52.0)
HEMOGLOBIN: 14.9 g/dL (ref 13.0–17.0)
MCH: 29.8 pg (ref 26.0–34.0)
MCHC: 32.7 g/dL (ref 30.0–36.0)
MCV: 91.2 fL (ref 78.0–100.0)
Platelets: 323 10*3/uL (ref 150–400)
RBC: 5 MIL/uL (ref 4.22–5.81)
RDW: 14.7 % (ref 11.5–15.5)
WBC: 12 10*3/uL — ABNORMAL HIGH (ref 4.0–10.5)

## 2017-09-08 LAB — ETHANOL: Alcohol, Ethyl (B): <10 mg/dL (ref <10)

## 2017-09-08 MED ORDER — OXYCODONE-ACETAMINOPHEN 5-325 MG PO TABS: 2.0000 | ORAL_TABLET | ORAL | 0 refills | Status: DC | PRN

## 2017-09-08 MED ORDER — OXYCODONE-ACETAMINOPHEN 5-325 MG PO TABS
2.0000 | ORAL_TABLET | Freq: Once | ORAL | Status: AC
  Administered 2017-09-08: 2 via ORAL
  Filled 2017-09-08: qty 2

## 2017-09-08 MED ORDER — TETANUS-DIPHTH-ACELL PERTUSSIS 5-2.5-18.5 LF-MCG/0.5 IM SUSP
0.5000 mL | Freq: Once | INTRAMUSCULAR | Status: DC
  Filled 2017-09-08: qty 0.5

## 2017-09-08 MED ORDER — IOPAMIDOL (ISOVUE-300) INJECTION 61%
INTRAVENOUS | Status: AC
  Administered 2017-09-08: 100 mL via INTRAVENOUS
  Filled 2017-09-08: qty 100

## 2017-09-08 MED ORDER — LIDOCAINE-EPINEPHRINE (PF) 2 %-1:200000 IJ SOLN
10.0000 mL | Freq: Once | INTRAMUSCULAR | Status: AC
  Administered 2017-09-08: 10 mL
  Filled 2017-09-08: qty 20

## 2017-09-08 MED ORDER — MORPHINE SULFATE (PF) 4 MG/ML IV SOLN
2.0000 mg | Freq: Once | INTRAVENOUS | Status: AC
  Administered 2017-09-08: 2 mg via INTRAVENOUS
  Filled 2017-09-08: qty 1

## 2017-09-08 MED ORDER — AMLODIPINE BESYLATE 5 MG PO TABS: 5.0000 mg | ORAL_TABLET | Freq: Every day | ORAL | 0 refills | Status: AC

## 2017-09-08 NOTE — ED Notes (Signed)
Patient transported to X-ray 

## 2017-09-08 NOTE — Discharge Instructions (Addendum)
Sutures out at primary doctor or urgent care or orthopedist 10 days. Call ortho office for follow up this week Use sling for shoulder pain and a knee immobilizer for stability. Take percocet as needed for pain control. You may take this with 600mg  ibuprofen every 6 hours for persistent pain. Take your blood pressure medication as prescribed. You may return for new or concerning symptoms.

## 2017-09-08 NOTE — ED Notes (Signed)
Delay in lab draw,  Pt in xray. 

## 2017-09-08 NOTE — ED Notes (Signed)
Pt has a 2 in laceration to left ankle and 3-4 inch laceration to left wrist -Bleeding controlled.
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
## 2017-09-08 NOTE — Progress Notes (Signed)
Orthopedic Tech Progress Note Patient Details:  Alex Johnson 08/11/1981 [id number]  Ortho Devices Type of Ortho Device: Shoulder immobilizer Ortho Device/Splint Location: RUE Ortho Device/Splint Interventions: Ordered, Application   Post Interventions Patient Tolerated: Well Instructions Provided: Care of device   [name], [name] 09/08/2017, 11:41 PM

## 2017-09-08 NOTE — ED Notes (Signed)
ED Provider at bedside. 

## 2017-09-08 NOTE — ED Notes (Signed)
Patient transported to CT 

## 2017-09-09 ENCOUNTER — Emergency Department (HOSPITAL_COMMUNITY): Payer: No Typology Code available for payment source

## 2017-09-09 MED ORDER — KETOROLAC TROMETHAMINE 30 MG/ML IJ SOLN: 30.0000 mg | Freq: Once | INTRAMUSCULAR | Status: DC

## 2017-09-09 MED ORDER — KETOROLAC TROMETHAMINE 30 MG/ML IJ SOLN
60.0000 mg | Freq: Once | INTRAMUSCULAR | Status: AC
  Administered 2017-09-09: 60 mg via INTRAMUSCULAR
  Filled 2017-09-09: qty 2

## 2017-09-09 NOTE — ED Notes (Signed)
Pt departed in NAD.  

## 2017-09-09 NOTE — ED Notes (Signed)
Patient transported to CT 
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
## 2017-09-13 ENCOUNTER — Encounter (INDEPENDENT_AMBULATORY_CARE_PROVIDER_SITE_OTHER): Payer: Self-pay | Admitting: Physician Assistant

## 2017-09-13 ENCOUNTER — Ambulatory Visit (INDEPENDENT_AMBULATORY_CARE_PROVIDER_SITE_OTHER): Payer: Self-pay | Admitting: Physician Assistant

## 2017-09-13 DIAGNOSIS — S42021A Displaced fracture of shaft of right clavicle, initial encounter for closed fracture: Secondary | ICD-10-CM

## 2017-09-13 DIAGNOSIS — M25561 Pain in right knee: Secondary | ICD-10-CM

## 2017-09-13 MED ORDER — OXYCODONE-ACETAMINOPHEN 5-325 MG PO TABS: 2.0000 | ORAL_TABLET | ORAL | 0 refills | Status: AC | PRN
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
## 2017-09-24 ENCOUNTER — Ambulatory Visit (INDEPENDENT_AMBULATORY_CARE_PROVIDER_SITE_OTHER): Payer: Self-pay | Admitting: Physician Assistant

## 2017-09-24 ENCOUNTER — Ambulatory Visit (INDEPENDENT_AMBULATORY_CARE_PROVIDER_SITE_OTHER): Payer: Self-pay

## 2017-09-24 ENCOUNTER — Encounter (INDEPENDENT_AMBULATORY_CARE_PROVIDER_SITE_OTHER): Payer: Self-pay | Admitting: Physician Assistant

## 2017-09-24 DIAGNOSIS — M25511 Pain in right shoulder: Secondary | ICD-10-CM

## 2017-09-24 DIAGNOSIS — S42021D Displaced fracture of shaft of right clavicle, subsequent encounter for fracture with routine healing: Secondary | ICD-10-CM

## 2017-09-24 DIAGNOSIS — M25561 Pain in right knee: Secondary | ICD-10-CM
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
## 2017-10-02 ENCOUNTER — Ambulatory Visit
Admission: RE | Admit: 2017-10-02 | Discharge: 2017-10-02 | Disposition: A | Discharge status: Home / Self Care | Payer: Self-pay | Source: Ambulatory Visit | Attending: Physician Assistant | Admitting: Physician Assistant

## 2017-10-02 DIAGNOSIS — M25561 Pain in right knee: Secondary | ICD-10-CM

## 2017-10-10 ENCOUNTER — Ambulatory Visit (INDEPENDENT_AMBULATORY_CARE_PROVIDER_SITE_OTHER): Payer: Self-pay | Admitting: Physician Assistant

## 2017-10-10 ENCOUNTER — Encounter (INDEPENDENT_AMBULATORY_CARE_PROVIDER_SITE_OTHER): Payer: Self-pay | Admitting: Physician Assistant

## 2017-10-10 DIAGNOSIS — M25561 Pain in right knee: Secondary | ICD-10-CM
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
## 2017-11-07 ENCOUNTER — Ambulatory Visit (INDEPENDENT_AMBULATORY_CARE_PROVIDER_SITE_OTHER): Payer: Self-pay | Admitting: Physician Assistant

## 2017-11-07 ENCOUNTER — Encounter (INDEPENDENT_AMBULATORY_CARE_PROVIDER_SITE_OTHER): Payer: Self-pay | Admitting: Physician Assistant

## 2017-11-07 DIAGNOSIS — M25561 Pain in right knee: Secondary | ICD-10-CM
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
## 2017-11-14 ENCOUNTER — Other Ambulatory Visit: Payer: Self-pay

## 2017-11-14 ENCOUNTER — Ambulatory Visit: Payer: Self-pay | Attending: Physician Assistant | Admitting: Physical Therapy

## 2017-11-14 DIAGNOSIS — M25561 Pain in right knee: Secondary | ICD-10-CM | POA: Insufficient documentation

## 2017-11-14 DIAGNOSIS — M6281 Muscle weakness (generalized): Secondary | ICD-10-CM | POA: Insufficient documentation
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
## 2017-11-20 ENCOUNTER — Ambulatory Visit: Payer: Self-pay | Admitting: Physical Therapy

## 2017-11-21 ENCOUNTER — Encounter (INDEPENDENT_AMBULATORY_CARE_PROVIDER_SITE_OTHER): Payer: Self-pay | Admitting: Physician Assistant

## 2017-11-21 ENCOUNTER — Ambulatory Visit (INDEPENDENT_AMBULATORY_CARE_PROVIDER_SITE_OTHER): Payer: Self-pay | Admitting: Physician Assistant

## 2017-11-21 ENCOUNTER — Ambulatory Visit (INDEPENDENT_AMBULATORY_CARE_PROVIDER_SITE_OTHER): Payer: Self-pay

## 2017-11-21 DIAGNOSIS — M25561 Pain in right knee: Secondary | ICD-10-CM

## 2017-11-21 DIAGNOSIS — M898X1 Other specified disorders of bone, shoulder: Secondary | ICD-10-CM

## 2017-11-21 DIAGNOSIS — S42021D Displaced fracture of shaft of right clavicle, subsequent encounter for fracture with routine healing: Secondary | ICD-10-CM
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
## 2017-11-22 ENCOUNTER — Other Ambulatory Visit (INDEPENDENT_AMBULATORY_CARE_PROVIDER_SITE_OTHER): Payer: Self-pay

## 2017-11-22 ENCOUNTER — Encounter: Payer: Self-pay | Admitting: Physical Therapy

## 2017-11-22 ENCOUNTER — Ambulatory Visit: Payer: Self-pay | Admitting: Physical Therapy

## 2017-11-22 DIAGNOSIS — M25561 Pain in right knee: Secondary | ICD-10-CM

## 2017-11-22 DIAGNOSIS — G8929 Other chronic pain: Secondary | ICD-10-CM

## 2017-11-22 DIAGNOSIS — M6281 Muscle weakness (generalized): Secondary | ICD-10-CM

## 2017-11-22 NOTE — Therapy (Signed)
[address] [place], [place], [address] Phone: [phone]   Fax:  [phone]  Physical Therapy Treatment  Patient Details  Name: Alex Johnson MRN: [id number] Date of Birth: 18-Sep-1980 Referring Provider: Erskine Emery PA-C   Encounter Date: 11/22/2017  PT End of Session - 11/22/17 1424    Visit Number  2    Date for PT Re-Evaluation  12/26/17    PT Start Time  6962    PT Stop Time  1425    PT Time Calculation (min)  40 min    Activity Tolerance  Patient tolerated treatment well    Behavior During Therapy  [facility] for tasks assessed/performed       History reviewed. No pertinent past medical history.  History reviewed. No pertinent surgical history.  There were no vitals filed for this visit.  Subjective Assessment - 11/22/17 1352    Subjective  "Pretty good, knee has been getting stronger"    Currently in Pain?  No/denies    Pain Score  0-No pain                       [facility] Adult PT Treatment/Exercise - 11/22/17 0001      Exercises   Exercises  Knee/Hip      Knee/Hip Exercises: Aerobic   Recumbent Bike  L2 x 51mn       Knee/Hip Exercises: Machines for Strengthening   Cybex Knee Extension  5lb 2x10     Cybex Knee Flexion  25lb 2x10    Total Gym Leg Press  20lb 2x10       Knee/Hip Exercises: Standing   Forward Step Up  Both;1 set;10 reps;Hand Hold: 0;Step Height: 6"    Walking with Sports Cord  black band all directions      Knee/Hip Exercises: Seated   Sit to Sand  10 reps some compensation                  PT Long Term Goals - 11/22/17 1425      PT LONG TERM GOAL #1   Title  Patient I with HEP    Status  Achieved      PT LONG TERM GOAL #2   Title  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    Status  Partially Met      PT LONG TERM GOAL #3   Title  Patient able to ascend/descend stairs with reciprocal gait  pattern and normal strength.    Status  On-going      PT LONG TERM GOAL #4   Title  Patient able to perform R knee lunge and Single leg squat with good form.    Status  On-going            Plan - 11/22/17 1426    Clinical Impression Statement  Pt tolerated an initial progression to therapeutic exercises well. No issues with today's interventions. Pt with come noticeable crepitus during sit to stand and resisted backwards walking. Pt reports that he may return to work tomorrow.  Denied ice post trewatment   Rehab Potential  Excellent    PT Frequency  2x / week    PT Duration  6 weeks    PT Treatment/Interventions  ADLs/Self Care Home Management;Cryotherapy;Electrical Stimulation;Moist Heat;Neuromuscular re-education;Therapeutic exercise;Stair training;Patient/family education;Manual techniques;Vasopneumatic Device;Taping    PT Next Visit Plan  R knee/hip strengthening, functional quad strengthening,  stairs       Patient will benefit from skilled therapeutic intervention in order to improve the following deficits and impairments:  Pain, Decreased strength, Impaired flexibility  Visit Diagnosis: Muscle weakness (generalized)  Acute pain of right knee     Problem List Patient Active Problem List   Diagnosis Date Noted  . Acute pain of right knee 10/10/2017    Scot Jun, PTA 11/22/2017, 2:27 PM  [address] [place], [place], [address] Phone: [phone]   Fax:  [phone]  Name: Alex Johnson MRN: [id number] Date of Birth: 02-Aug-1981

## 2017-11-27 ENCOUNTER — Ambulatory Visit: Payer: Self-pay | Admitting: Physical Therapy

## 2017-11-29 ENCOUNTER — Ambulatory Visit: Payer: Self-pay | Admitting: Physical Therapy

## 2017-12-03 ENCOUNTER — Encounter: Payer: Self-pay | Admitting: Physical Therapy

## 2017-12-03 ENCOUNTER — Ambulatory Visit: Payer: Self-pay | Admitting: Physical Therapy

## 2017-12-03 DIAGNOSIS — M6281 Muscle weakness (generalized): Secondary | ICD-10-CM

## 2017-12-03 DIAGNOSIS — M25561 Pain in right knee: Secondary | ICD-10-CM

## 2017-12-03 NOTE — Therapy (Addendum)
[address] [place], [place], [address] Phone: [phone]   Fax:  [phone]  Physical Therapy Treatment  Patient Details  Name: Alex Johnson MRN: [id number] Date of Birth: 05-11-1981 Referring Provider: Erskine Emery PA-C   Encounter Date: 12/03/2017  PT End of Session - 12/03/17 1431    Visit Number  3    Number of Visits  12    Date for PT Re-Evaluation  12/26/17    PT Start Time  6063    PT Stop Time  1430    PT Time Calculation (min)  33 min       History reviewed. No pertinent past medical history.  History reviewed. No pertinent surgical history.  There were no vitals filed for this visit.  Subjective Assessment - 12/03/17 1359    Subjective  "Im good, Im good"    Currently in Pain?  No/denies    Pain Score  0-No pain                       [facility] Adult PT Treatment/Exercise - 12/03/17 0001      Ambulation/Gait   Stairs  Yes    Stairs Assistance  7: Independent    Stair Management Technique  No rails    Number of Stairs  48    Height of Stairs  6    Door Management  --      High Level Balance   High Level Balance Comments  SLS with cone taps.       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    Status  Achieved      PT LONG TERM GOAL #5   Title  Patient able to perform ADLS with 20/16pain or less in the R knee.     Status  Achieved            Plan - 12/03/17 1433    Clinical Impression Statement  Pt ~12 minutes late for today's PT session. Pt has progressed towards all goals. No issues with today's exercises. He goes struggle with lunges and SLS with cone taps. Audible crepitus noted with most exercises.     Rehab Potential  Excellent    PT Frequency  2x / week    PT Duration  6 weeks    PT Treatment/Interventions  ADLs/Self Care Home Management;Cryotherapy;Electrical Stimulation;Moist Heat;Neuromuscular re-education;Therapeutic exercise;Stair training;Patient/family education;Manual techniques;Vasopneumatic Device;Taping    PT Next Visit Plan  R knee/hip strengthening, functional quad strengthening, stairs       Patient will benefit from skilled therapeutic intervention in order to improve the following deficits and impairments:  Pain,  Decreased strength, Impaired flexibility  Visit Diagnosis: Muscle weakness (generalized)  Acute pain of right knee     Problem List Patient Active Problem List   Diagnosis Date Noted  . Acute pain of right knee 10/10/2017   PHYSICAL THERAPY DISCHARGE SUMMARY  Visits from Start of Care: 3  Plan: Patient agrees to discharge.  Patient goals were partially met. Patient is being discharged due to being pleased with the current functional level.  ?????     Scot Jun, PTA 12/03/2017, 2:38 PM  [address] [place], [place], [address] Phone: [phone]   Fax:  [phone]  Name: Alex Johnson MRN: [id number] Date of Birth: Mar 06, 1981

## 2017-12-11 ENCOUNTER — Ambulatory Visit: Payer: Self-pay | Admitting: Physical Therapy
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
## 2022-03-27 ENCOUNTER — Encounter (HOSPITAL_BASED_OUTPATIENT_CLINIC_OR_DEPARTMENT_OTHER): Payer: Self-pay | Admitting: Pediatrics

## 2022-03-27 ENCOUNTER — Emergency Department (HOSPITAL_BASED_OUTPATIENT_CLINIC_OR_DEPARTMENT_OTHER): Payer: Self-pay

## 2022-03-27 ENCOUNTER — Other Ambulatory Visit: Payer: Self-pay

## 2022-03-27 ENCOUNTER — Emergency Department (HOSPITAL_BASED_OUTPATIENT_CLINIC_OR_DEPARTMENT_OTHER)
Admission: EM | Admit: 2022-03-27 | Discharge: 2022-03-28 | Disposition: A | Discharge status: Home / Self Care | Payer: Self-pay | Attending: Emergency Medicine | Admitting: Emergency Medicine

## 2022-03-27 DIAGNOSIS — L0211 Cutaneous abscess of neck: Secondary | ICD-10-CM | POA: Insufficient documentation

## 2022-03-27 DIAGNOSIS — R519 Headache, unspecified: Secondary | ICD-10-CM | POA: Insufficient documentation

## 2022-03-27 DIAGNOSIS — R030 Elevated blood-pressure reading, without diagnosis of hypertension: Secondary | ICD-10-CM

## 2022-03-27 DIAGNOSIS — D72829 Elevated white blood cell count, unspecified: Secondary | ICD-10-CM | POA: Insufficient documentation

## 2022-03-27 DIAGNOSIS — L0291 Cutaneous abscess, unspecified: Secondary | ICD-10-CM

## 2022-03-27 DIAGNOSIS — R739 Hyperglycemia, unspecified: Secondary | ICD-10-CM

## 2022-03-27 LAB — CBC WITH DIFFERENTIAL/PLATELET
Abs Immature Granulocytes: 0.06 10*3/uL (ref 0.00–0.07)
Basophils Absolute: 0.1 10*3/uL (ref 0.0–0.1)
Basophils Relative: 0 %
Eosinophils Absolute: 0.1 10*3/uL (ref 0.0–0.5)
Eosinophils Relative: 1 %
HCT: 42.4 % (ref 39.0–52.0)
Hemoglobin: 14.2 g/dL (ref 13.0–17.0)
Immature Granulocytes: 1 %
Lymphocytes Relative: 12 %
Lymphs Abs: 1.4 10*3/uL (ref 0.7–4.0)
MCH: 29.6 pg (ref 26.0–34.0)
MCHC: 33.5 g/dL (ref 30.0–36.0)
MCV: 88.3 fL (ref 80.0–100.0)
Monocytes Absolute: 1 10*3/uL (ref 0.1–1.0)
Monocytes Relative: 8 %
Neutro Abs: 9.4 10*3/uL — ABNORMAL HIGH (ref 1.7–7.7)
Neutrophils Relative %: 78 %
Platelets: 338 10*3/uL (ref 150–400)
RBC: 4.8 MIL/uL (ref 4.22–5.81)
RDW: 13.7 % (ref 11.5–15.5)
WBC: 12 10*3/uL — ABNORMAL HIGH (ref 4.0–10.5)
nRBC: 0 % (ref 0.0–0.2)

## 2022-03-27 LAB — COMPREHENSIVE METABOLIC PANEL
ALT: 21 U/L (ref 0–44)
AST: 22 U/L (ref 15–41)
Albumin: 4 g/dL (ref 3.5–5.0)
Alkaline Phosphatase: 73 U/L (ref 38–126)
Anion gap: 9 (ref 5–15)
BUN: 12 mg/dL (ref 6–20)
CO2: 24 mmol/L (ref 22–32)
Calcium: 8.9 mg/dL (ref 8.9–10.3)
Chloride: 102 mmol/L (ref 98–111)
Creatinine, Ser: 1.02 mg/dL (ref 0.61–1.24)
GFR, Estimated: >60 mL/min (ref >60)
Glucose, Bld: 303 mg/dL — ABNORMAL HIGH (ref 70–99)
Potassium: 3.6 mmol/L (ref 3.5–5.1)
Sodium: 135 mmol/L (ref 135–145)
Total Bilirubin: 0.5 mg/dL (ref 0.3–1.2)
Total Protein: 8.6 g/dL — ABNORMAL HIGH (ref 6.5–8.1)

## 2022-03-27 LAB — CBG MONITORING, ED: Glucose-Capillary: 229 mg/dL — ABNORMAL HIGH (ref 70–99)

## 2022-03-27 LAB — LACTIC ACID, PLASMA: Lactic Acid, Venous: 1 mmol/L (ref 0.5–1.9)

## 2022-03-27 MED ORDER — VANCOMYCIN HCL IN DEXTROSE 1-5 GM/200ML-% IV SOLN
1000.0000 mg | Freq: Once | INTRAVENOUS | Status: AC
  Administered 2022-03-27: 1000 mg via INTRAVENOUS
  Filled 2022-03-27: qty 200

## 2022-03-27 MED ORDER — IOHEXOL 300 MG/ML  SOLN
75.0000 mL | Freq: Once | INTRAMUSCULAR | Status: AC | PRN
  Administered 2022-03-27: 75 mL via INTRAVENOUS

## 2022-03-27 MED ORDER — IBUPROFEN 200 MG PO TABS
600.0000 mg | ORAL_TABLET | Freq: Once | ORAL | Status: AC
  Administered 2022-03-27: 600 mg via ORAL
  Filled 2022-03-27: qty 1

## 2022-03-27 MED ORDER — DOXYCYCLINE HYCLATE 100 MG PO CAPS: 100.0000 mg | ORAL_CAPSULE | Freq: Two times a day (BID) | ORAL | 0 refills | Status: DC

## 2022-03-27 MED ORDER — SODIUM CHLORIDE 0.9 % IV BOLUS
1000.0000 mL | Freq: Once | INTRAVENOUS | Status: AC
  Administered 2022-03-27: 1000 mL via INTRAVENOUS

## 2022-03-27 NOTE — Discharge Instructions (Signed)
Take the antibiotics as discussed.  Use warm compresses to the area.  Follow-up with your primary care doctor within the next few days for recheck.  Return to the emergency room if you have any worsening symptoms.  Make sure that you are taking your prescribed medicines at home and checking your blood pressure and blood sugar frequently.

## 2022-03-27 NOTE — ED Notes (Signed)
Checked CBG 229, RN informed

## 2022-03-27 NOTE — ED Notes (Signed)
Patient transported to CT 

## 2022-03-27 NOTE — ED Provider Notes (Signed)
[facility] EMERGENCY DEPARTMENT Provider Note   CSN: [id number] Arrival date & time: 03/27/22  1948     History  Chief Complaint  Patient presents with   Abscess    Alex Johnson is a 41 y.o. male.  Patient is a 41 year old male who presents with an infection to the back of his neck.  Says it started 3 days ago.  His wife has been compressing it and has had some discharge.  He says is becoming more painful and he decided to come in because of that.  He denies any fevers.  He is noted to be markedly tachycardic on arrival.  No nausea or vomiting.  He does have headache.  He does report that he has not taken his medications in the last several days.       Home Medications Prior to Admission medications   Medication Sig Start Date End Date Taking? Authorizing Provider  doxycycline (VIBRAMYCIN) 100 MG capsule Take 1 capsule (100 mg total) by mouth 2 (two) times daily. One po bid x 7 days 03/27/22  Yes Rolan Bucco, MD  amLODipine (NORVASC) 5 MG tablet Take 1 tablet (5 mg total) by mouth daily. 09/08/17   Margarita Grizzle, MD  esomeprazole (NEXIUM) 20 MG capsule Take 20 mg by mouth daily as needed (heartburn).    [provider]  oxyCODONE-acetaminophen (PERCOCET/ROXICET) 5-325 MG tablet Take 2 tablets by mouth every 4 (four) hours as needed for severe pain. Patient not taking: Reported on 11/07/2017 09/13/17   Kirtland Bouchard, PA-C  pantoprazole (PROTONIX) 20 MG tablet Take 1 tablet (20 mg total) by mouth daily. 01/12/16   Raeford Razor, MD      Allergies    Patient has no known allergies.    Review of Systems   Review of Systems  Constitutional:  Negative for chills, diaphoresis, fatigue and fever.  HENT:  Negative for congestion, rhinorrhea and sneezing.   Eyes: Negative.   Respiratory:  Negative for cough, chest tightness and shortness of breath.   Cardiovascular:  Negative for chest pain and leg swelling.  Gastrointestinal:  Negative for abdominal pain,  blood in stool, diarrhea, nausea and vomiting.  Genitourinary:  Negative for difficulty urinating, flank pain, frequency and hematuria.  Musculoskeletal:  Negative for arthralgias and back pain.  Skin:  Positive for wound. Negative for rash.  Neurological:  Positive for headaches. Negative for dizziness, speech difficulty, weakness and numbness.    Physical Exam Updated Vital Signs BP (!) 160/101   Pulse 92   Temp 98 F (36.7 C) (Oral)   Resp 18   Ht 5\' 7"  (1.702 m)   Wt 127 kg   SpO2 96%   BMI 43.85 kg/m  Physical Exam Constitutional:      Appearance: He is well-developed.  HENT:     Head: Normocephalic and atraumatic.  Eyes:     Pupils: Pupils are equal, round, and reactive to light.  Neck:     Comments: Large, 6-7 [place]eter indurated area to the posterior neck.  There is a central small scab where I presume the drainage was coming from.  I do not appreciate any fluctuance.  Warm to the touch. Cardiovascular:     Rate and Rhythm: Normal rate and regular rhythm.     Heart sounds: Normal heart sounds.  Pulmonary:     Effort: Pulmonary effort is normal. No respiratory distress.     Breath sounds: Normal breath sounds. No wheezing or rales.  Chest:  Chest wall: No tenderness.  Abdominal:     General: Bowel sounds are normal.     Palpations: Abdomen is soft.     Tenderness: There is no abdominal tenderness. There is no guarding or rebound.  Musculoskeletal:        General: Normal range of motion.     Cervical back: Normal range of motion and neck supple.  Lymphadenopathy:     Cervical: No cervical adenopathy.  Skin:    General: Skin is warm and dry.     Findings: No rash.  Neurological:     Mental Status: He is alert and oriented to person, place, and time.     ED Results / Procedures / Treatments   Labs (all labs ordered are listed, but only abnormal results are displayed) Labs Reviewed  CBC WITH DIFFERENTIAL/PLATELET - Abnormal; Notable for the following  components:      Result Value   WBC 12.0 (*)    Neutro Abs 9.4 (*)    All other components within normal limits  COMPREHENSIVE METABOLIC PANEL - Abnormal; Notable for the following components:   Glucose, Bld 303 (*)    Total Protein 8.6 (*)    All other components within normal limits  CULTURE, BLOOD (ROUTINE X 2)  CULTURE, BLOOD (ROUTINE X 2)  LACTIC ACID, PLASMA  CBG MONITORING, ED    EKG None  Radiology CT Soft Tissue Neck W Contrast  Result Date: 03/27/2022 CLINICAL DATA:  Abscess on posterior neck for 3 days EXAM: CT NECK WITH CONTRAST TECHNIQUE: Multidetector CT imaging of the neck was performed using the standard protocol following the bolus administration of intravenous contrast. RADIATION DOSE REDUCTION: This exam was performed according to the departmental dose-optimization program which includes automated exposure control, adjustment of the mA and/or kV according to patient size and/or use of iterative reconstruction technique. CONTRAST:  67mL OMNIPAQUE IOHEXOL 300 MG/ML  SOLN COMPARISON:  None Available. FINDINGS: Evaluation of the lower neck and upper chest is limited by body habitus and motion artifact. Pharynx and larynx: Normal. No mass or swelling. Salivary glands: No inflammation, mass, or stone. Thyroid: Normal. Lymph nodes: Prominent lymph nodes in the right-greater-than-left posterior chain (series 3, image 8-22), measuring up to 9 mm. No anterior chain lymph node enlargement. Vascular: Patent. Limited intracranial: Negative. Visualized orbits: Not included in the field of view. Mastoids and visualized paranasal sinuses: Clear. Skeleton: No acute or aggressive process. Upper chest: No focal pulmonary opacity or pleural effusion. Other: In the subcutaneous fat of the posterior right neck, there is fat stranding and enlarged posterior chain lymph nodes, right-greater-than-left. There focal area of lower density, which measures up to 9 x 9 x 15 mm (series 3, image 28 and series  6, image 43). This collection does not appear to have significant peripheral enhancement and is favored to represent phlegmon rather than frank abscess. IMPRESSION: 1. Phlegmon versus developing abscess in the subcutaneous fat of the posterior right neck, without significant peripheral enhancement. 2. Prominent posterior chain lymph nodes, right-greater-than-left, likely reactive. Electronically Signed   By: Wiliam Ke M.D.   On: 03/27/2022 23:04    Procedures Procedures    Medications Ordered in ED Medications  vancomycin (VANCOCIN) IVPB 1000 mg/200 mL premix (0 mg Intravenous Stopped 03/27/22 2324)  sodium chloride 0.9 % bolus 1,000 mL (0 mLs Intravenous Stopped 03/27/22 2325)  ibuprofen (ADVIL) tablet 600 mg (600 mg Oral Given 03/27/22 2208)  iohexol (OMNIPAQUE) 300 MG/ML solution 75 mL (75 mLs Intravenous Contrast Given 03/27/22  2217)    ED Course/ Medical Decision Making/ A&P                           Medical Decision Making Amount and/or Complexity of Data Reviewed Labs: ordered. Radiology: ordered.  Risk Prescription drug management.   Patient is a 41 year old male who presents with possible abscess/cellulitis to the the posterior neck area.  Initially he was very tachycardic.  He is not febrile.  He had a large indurated area of the posterior neck.  It was difficult to ascertain how deep it went and whether there was a drainable abscess.  Given this reason, CT scan was performed which shows no definite drainable abscess.  There is potentially a phlegmon versus early abscess forming.  His labs are nonconcerning other than his glucose is elevated at 300.  His WBC count is mildly elevated as well.  He was given IV fluids and vancomycin.  At this point he is well-appearing without suggestions of sepsis.  His lactate is normal.  He is afebrile.  His heart rate has normalized into the 90s.  I do not see an indication for hospitalization.  Will try outpatient antibiotics.  Was given a  prescription for doxycycline.  He was encouraged to have close follow-up with his PCP for recheck.  I did counsel him on taking his medications regularly.  He has not been taking his home medications regularly.  His blood pressure was markedly elevated on arrival but has improved without treatment.  He was given strict return precautions.  Final Clinical Impression(s) / ED Diagnoses Final diagnoses:  Abscess  Hyperglycemia  Elevated blood pressure reading    Rx / DC Orders ED Discharge Orders          Ordered    doxycycline (VIBRAMYCIN) 100 MG capsule  2 times daily        03/27/22 2322              Rolan Bucco, MD 03/27/22 2330

## 2022-03-27 NOTE — ED Triage Notes (Addendum)
C/O abscess on back of neck x 3 days; reported wife at home have been compressing it and drainage has come out x 5 occurrences; denies DM

## 2022-03-28 MED ORDER — DOXYCYCLINE HYCLATE 100 MG PO CAPS: 100.0000 mg | ORAL_CAPSULE | Freq: Two times a day (BID) | ORAL | 0 refills | Status: AC

## 2022-03-28 NOTE — ED Notes (Signed)
RN provided AVS using Teachback Method. Patient verbalizes understanding of Discharge Instructions. Opportunity for Questioning and Answers were provided by RN. Patient Discharged from ED ambulatory to Home with Family. ? ?

## 2022-04-02 LAB — CULTURE, BLOOD (ROUTINE X 2)
Culture: NO GROWTH
Culture: NO GROWTH
Special Requests: ADEQUATE
Special Requests: ADEQUATE
